# Patient Record
Sex: Female | Born: 2000
Health system: Southern US, Community
[De-identification: ages and names within clinical notes are randomized; demographics above are authoritative.]

## PROBLEM LIST (undated history)

## (undated) DIAGNOSIS — R569 Unspecified convulsions: Secondary | ICD-10-CM

## (undated) DIAGNOSIS — G901 Familial dysautonomia [Riley-Day]: Secondary | ICD-10-CM

## (undated) DIAGNOSIS — F840 Autistic disorder: Secondary | ICD-10-CM

## (undated) DIAGNOSIS — F32A Depression, unspecified: Secondary | ICD-10-CM

## (undated) DIAGNOSIS — K219 Gastro-esophageal reflux disease without esophagitis: Secondary | ICD-10-CM

## (undated) HISTORY — DX: Depression, unspecified: F32.A

## (undated) HISTORY — DX: Autistic disorder: F84.0

## (undated) HISTORY — DX: Gastro-esophageal reflux disease without esophagitis: K21.9

## (undated) HISTORY — DX: Unspecified convulsions: R56.9

## (undated) HISTORY — DX: Familial dysautonomia (riley-day): G90.1

---
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
## 2022-07-05 IMAGING — US US ABDOMEN LIMITED
1 series · 14 of 25 positions shown · non-contrast
Comparison: None.

CLINICAL DATA: Initial evaluation for right upper quadrant
abdominal pain.

EXAM:
ULTRASOUND ABDOMEN LIMITED RIGHT UPPER QUADRANT

[Series 1: us abdomen limited · 14 of 43 slices shown]
[im 1/43]
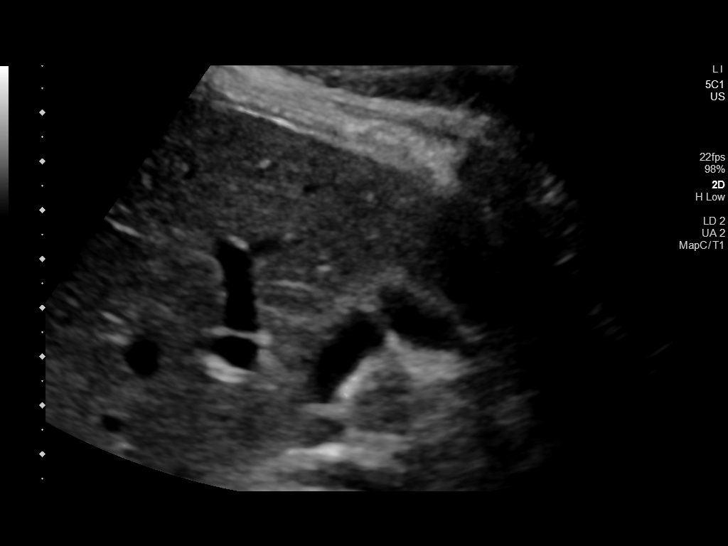
[im 4/43]
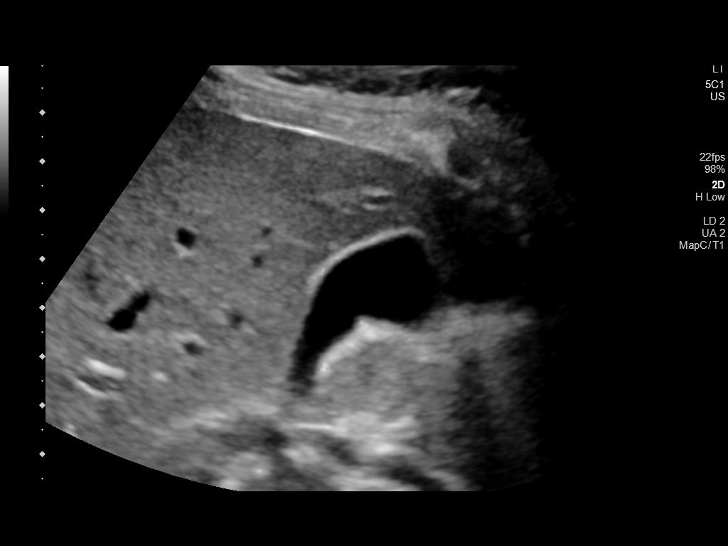
[im 8/43]
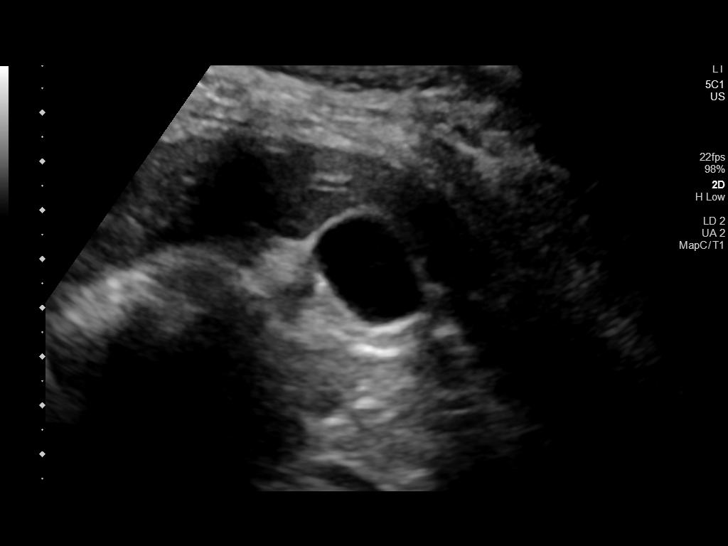
[im 11/43]
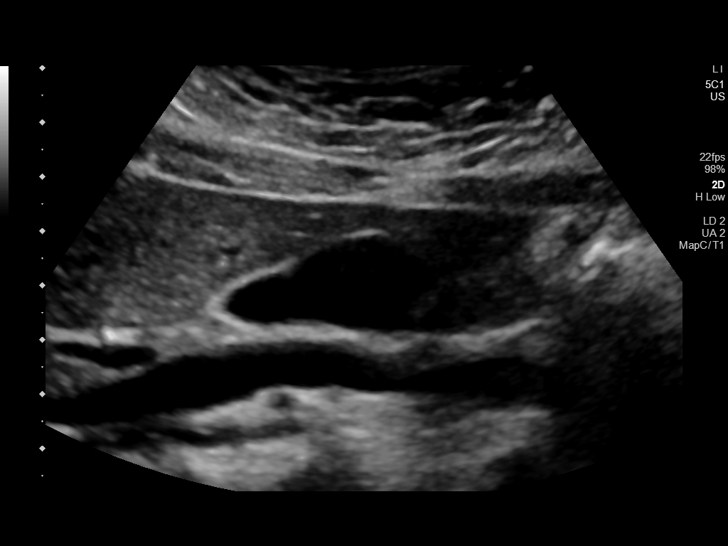
[im 15/43]
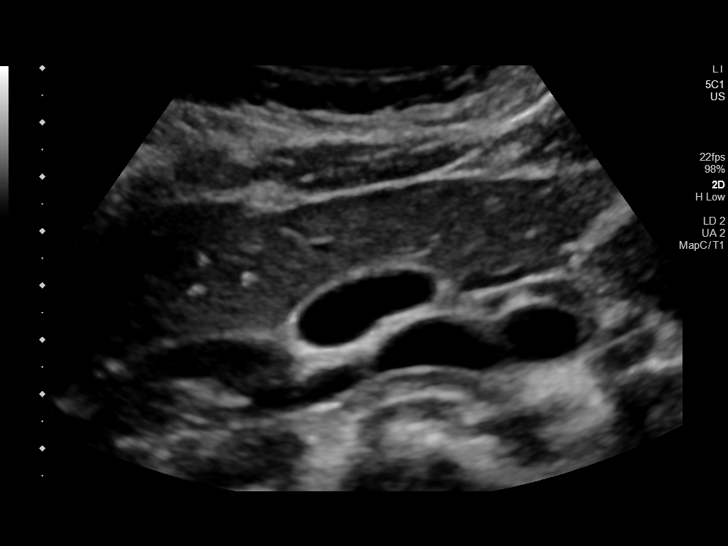
[im 16/43]
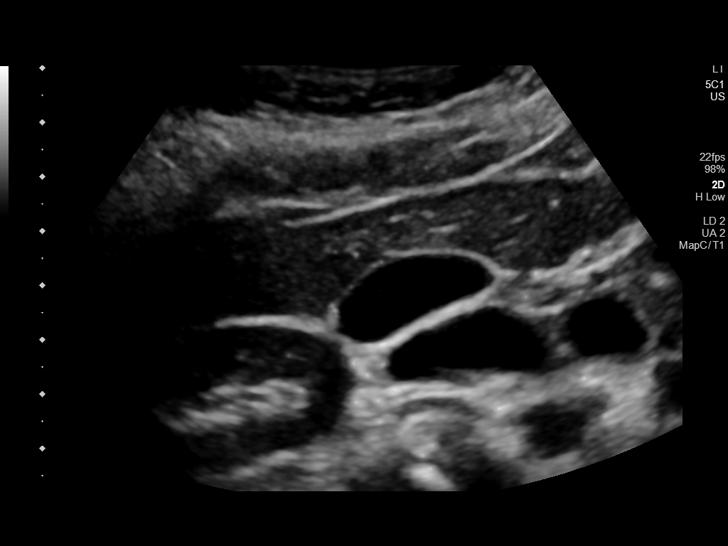
[im 20/43]
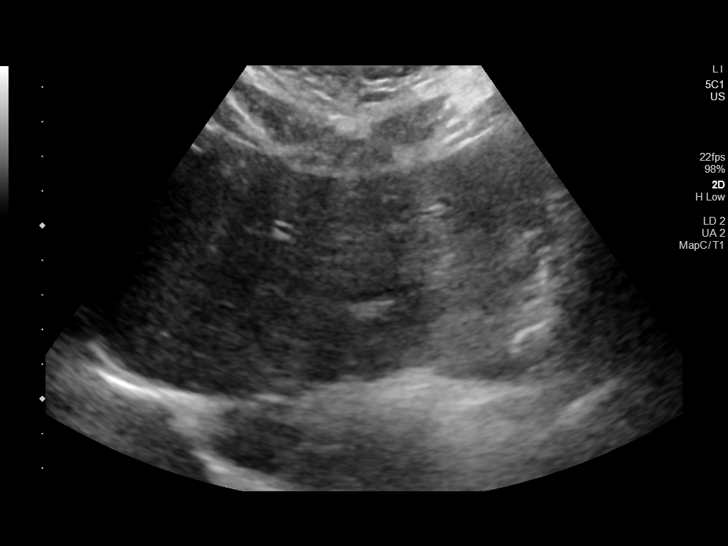
[im 23/43]
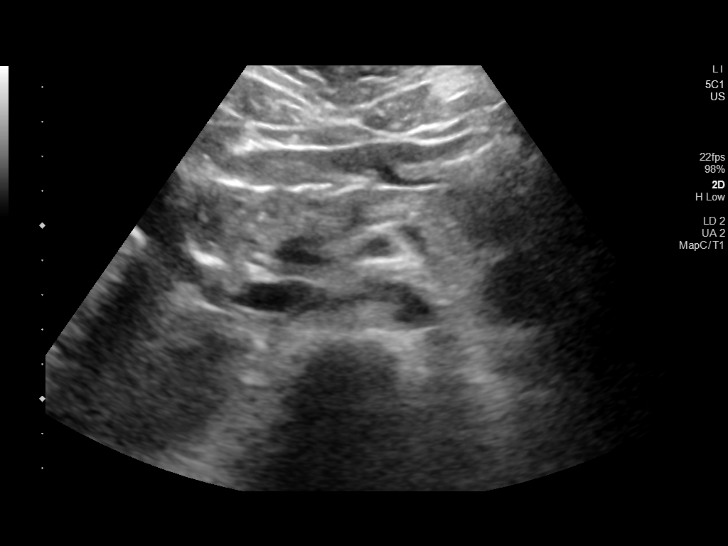
[im 27/43]
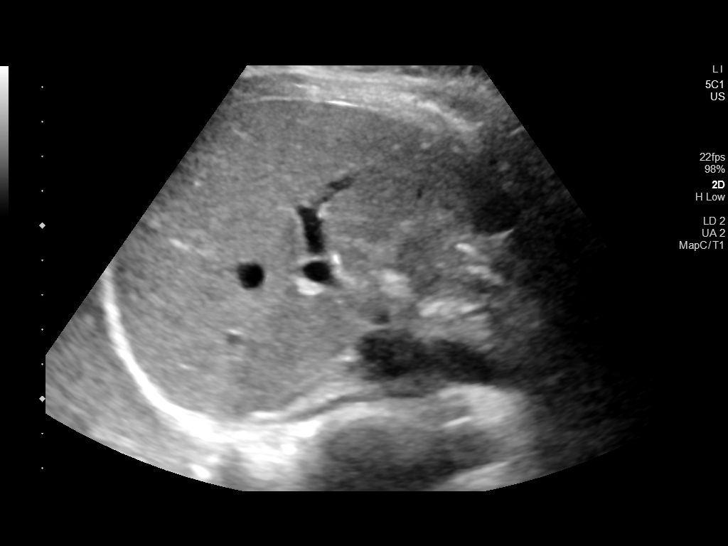
[im 29/43]
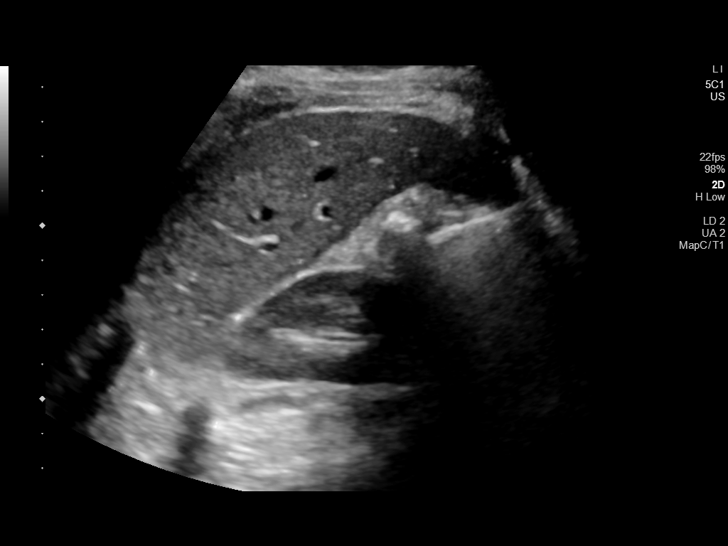
[im 32/43]
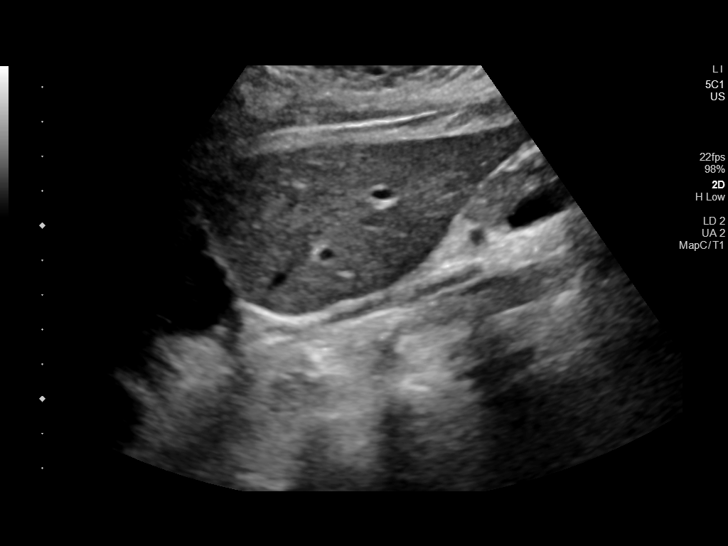
[im 36/43]
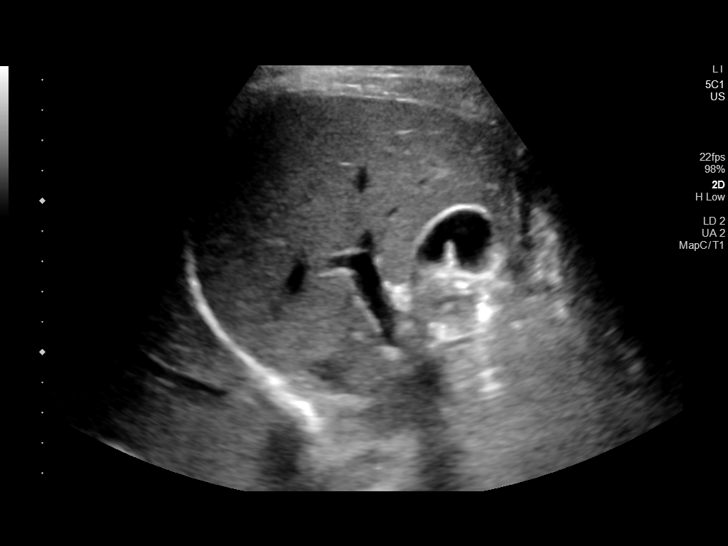
[im 39/43]
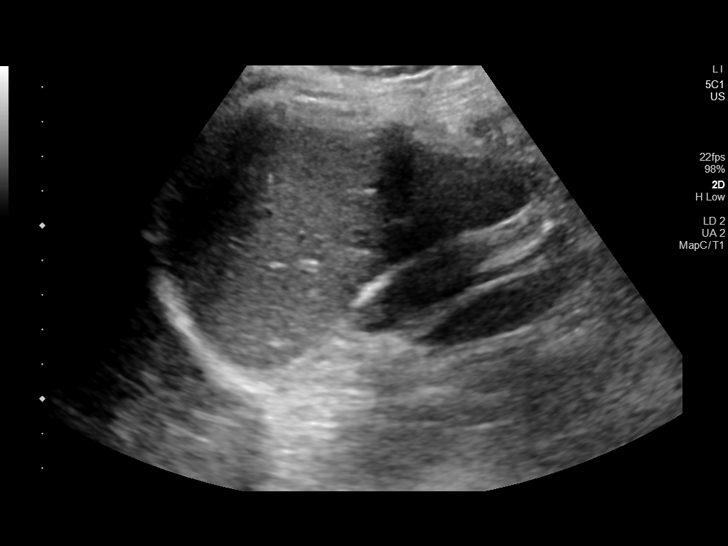
[im 43/43]
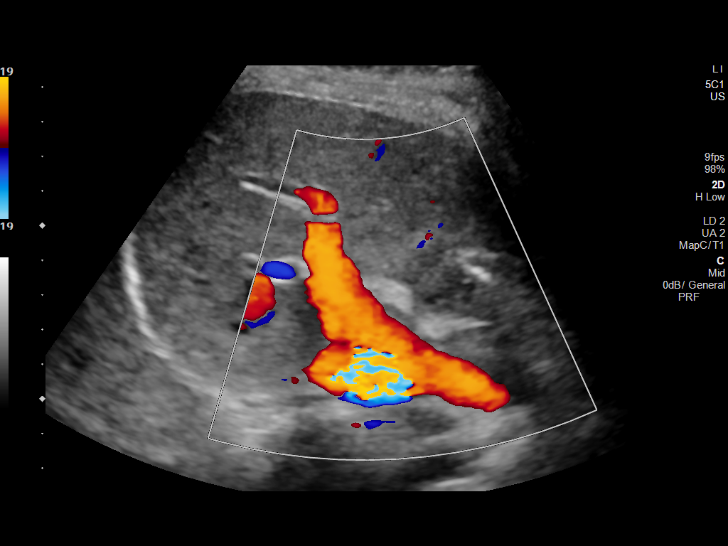

[14 of 25 positions shown; findings below may reference images not displayed]

FINDINGS: Gallbladder:

No gallstones or wall thickening visualized. No sonographic Murphy
sign noted by sonographer.

Common bile duct:

Diameter: 2.2 mm

Liver:

No focal lesion identified. Within normal limits in parenchymal
echogenicity. Portal vein is patent on color Doppler imaging with
normal direction of blood flow towards the liver.

Other: None.
IMPRESSION: Normal right upper quadrant ultrasound.

## 2023-01-11 ENCOUNTER — Encounter: Payer: Self-pay | Admitting: Physician Assistant

## 2023-01-11 ENCOUNTER — Ambulatory Visit: Payer: Managed Care, Other (non HMO) | Admitting: Physician Assistant

## 2023-01-11 ENCOUNTER — Other Ambulatory Visit (HOSPITAL_COMMUNITY)
Admission: RE | Admit: 2023-01-11 | Discharge: 2023-01-11 | Disposition: A | Discharge status: Home / Self Care | Payer: Managed Care, Other (non HMO) | Source: Ambulatory Visit | Attending: Physician Assistant | Admitting: Physician Assistant

## 2023-01-11 VITALS — BP 114/78 | HR 70 | Temp 97.5°F | Ht 59.45 in | Wt 195.4 lb

## 2023-01-11 DIAGNOSIS — Z13220 Encounter for screening for lipoid disorders: Secondary | ICD-10-CM

## 2023-01-11 DIAGNOSIS — Z01419 Encounter for gynecological examination (general) (routine) without abnormal findings: Secondary | ICD-10-CM

## 2023-01-11 NOTE — Progress Notes (Signed)
Subjective:    Patient ID: Elizabeth Ball, female    DOB: 03/08/2001, 22 y.o.   MRN: [id number]  Chief Complaint  Patient presents with   New Patient (Initial Visit)    New pt in office to establish care with PCP; pt requests wellness visit/CPE today as well; last seen PCP since 2019; pt has no concerns to discuss at this time; pt is fasting in case labs being completed and also requests all labs to go to [facility] due to Mom working for [facility]    HPI 22 y.o. patient presents today for new patient establishment with me.   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   Current Care Team: None    Acute Concerns: None, needing physical completed.    Past Medical History:  Diagnosis Date   Autism    Depression    Dysautonomia (HCC)    GERD (gastroesophageal reflux disease)    Seizures (HCC)    childhood, grew out of this age 57 or so    History reviewed. No pertinent surgical history.  Family History  Problem Relation Age of Onset   Healthy Mother    High Cholesterol Father    Cancer Maternal Grandmother    Hyperlipidemia Maternal Grandmother    Hypertension Maternal Grandmother    Depression Maternal Grandfather    Heart attack Maternal Grandfather    Hypertension Maternal Grandfather    Hyperlipidemia Maternal Grandfather    Cancer Paternal Grandfather        Died at the age of 75-Lymphoma    Social History   Tobacco Use   Smoking status: Never   Smokeless tobacco: Never  Vaping Use   Vaping Use: Never used  Substance Use Topics   Alcohol use: Yes    Alcohol/week: 3.0 standard drinks of alcohol    Types: 1 Glasses of wine, 1 Cans of beer, 1 Shots of liquor per week   Drug use: Not Currently    Types: Marijuana     Allergies  Allergen Reactions   Amoxicillin     Parents reported seizures    Review of Systems NEGATIVE UNLESS OTHERWISE INDICATED IN HPI      Objective:     BP 114/78 (BP Location: Left Arm)   Pulse 70   Temp (!) 97.5 F (36.4 C)  (Temporal)   Ht 4' 11.45" (1.51 m)   Wt 195 lb 6.4 oz (88.6 kg)   LMP 01/04/2023 (Approximate)   SpO2 99%   BMI 38.87 kg/m   Wt Readings from Last 3 Encounters:  01/11/23 195 lb 6.4 oz (88.6 kg)  12/20/17 99 lb 9.6 oz (45.2 kg) (11 %, Z= -1.20)*  11/12/17 99 lb 9.6 oz (45.2 kg) (12 %, Z= -1.17)*   * Growth percentiles are based on CDC (Girls, 2-20 Years) data.    BP Readings from Last 3 Encounters:  01/11/23 114/78  12/20/17 (!) 102/62 (36 %, Z = -0.36 /  46 %, Z = -0.10)*  11/12/17 (!) 100/60 (31 %, Z = -0.50 /  40 %, Z = -0.25)*   *BP percentiles are based on the 2017 AAP Clinical Practice Guideline for girls     Physical Exam Vitals and nursing note reviewed. Exam conducted with a chaperone present.  Constitutional:      Appearance: Normal appearance. She is normal weight. She is not toxic-appearing.  HENT:     Head: Normocephalic and atraumatic.     Right Ear: Tympanic membrane, ear canal and external ear normal.  Left Ear: Tympanic membrane, ear canal and external ear normal.     Nose: Nose normal.     Mouth/Throat:     Mouth: Mucous membranes are moist.  Eyes:     Extraocular Movements: Extraocular movements intact.     Conjunctiva/sclera: Conjunctivae normal.     Pupils: Pupils are equal, round, and reactive to light.  Neck:     Thyroid: No thyroid mass, thyromegaly or thyroid tenderness.  Cardiovascular:     Rate and Rhythm: Normal rate and regular rhythm.     Pulses: Normal pulses.     Heart sounds: Normal heart sounds.  Pulmonary:     Effort: Pulmonary effort is normal.     Breath sounds: Normal breath sounds.  Chest:     Chest wall: No mass.  Breasts:    Right: Normal. No swelling, bleeding, inverted nipple, mass, nipple discharge, skin change or tenderness.     Left: Normal. No swelling, bleeding, inverted nipple, mass, nipple discharge, skin change or tenderness.  Abdominal:     General: Abdomen is flat. Bowel sounds are normal.     Palpations:  Abdomen is soft.  Genitourinary:    General: Normal vulva.     Labia:        Right: No rash, tenderness or lesion.        Left: No rash, tenderness or lesion.      Vagina: Normal.     Cervix: Cervical bleeding (spotting) present.     Uterus: Normal.      Adnexa: Right adnexa normal and left adnexa normal.  Musculoskeletal:        General: Normal range of motion.     Cervical back: Normal range of motion and neck supple.     Right lower leg: No edema.     Left lower leg: No edema.  Lymphadenopathy:     Cervical: No cervical adenopathy.     Upper Body:     Right upper body: No supraclavicular, axillary or pectoral adenopathy.     Left upper body: No supraclavicular, axillary or pectoral adenopathy.  Skin:    General: Skin is warm and dry.     Findings: No lesion.  Neurological:     General: No focal deficit present.     Mental Status: She is alert and oriented to person, place, and time.  Psychiatric:        Mood and Affect: Mood normal.        Behavior: Behavior normal.        Thought Content: Thought content normal.        Judgment: Judgment normal.        Assessment & Plan:  Well woman exam with routine gynecological exam -     Cytology - PAP -     CBC with Differential/Platelet -     Comprehensive metabolic panel -     Lipid panel -     TSH  Screening for cholesterol level -     Lipid panel   Age-appropriate screening and counseling performed today. Will check labs and call with results. Preventive measures discussed and printed in AVS for patient.   Patient Counseling: [x]   Nutrition: Stressed importance of moderation in sodium/caffeine intake, saturated fat and cholesterol, caloric balance, sufficient intake of fresh fruits, vegetables, and fiber.  [x]   Stressed the importance of regular exercise.   [x]   Substance Abuse: Discussed cessation/primary prevention of tobacco, alcohol, or other drug use; driving or other dangerous activities under the influence;  availability of treatment for abuse.   []   Injury prevention: Discussed safety belts, safety helmets, smoke detector, smoking near bedding or upholstery.   [x]   Sexuality: Discussed sexually transmitted diseases, partner selection, use of condoms, avoidance of unintended pregnancy  and contraceptive alternatives.   [x]   Dental health: Discussed importance of regular tooth brushing, flossing, and dental visits.  [x]   Health maintenance and immunizations reviewed. Please refer to Health maintenance section.      Return in about 1 year (around 01/11/2024) for physical.     Modine Oppenheimer M Caliana Spires, PA-C

## 2023-01-11 NOTE — Patient Instructions (Addendum)
Welcome to [facility]! It was a pleasure meeting you today.  Labs and pap completed today.  As discussed, Please schedule a 12 month follow up visit today.  PLEASE NOTE:  If you had any LAB tests please let us know if you have not heard back within a few days. You may see your results on MyChart before we have a chance to review them but we will give you a call once they are reviewed by [place]. If we ordered any REFERRALS today, please let us know if you have not heard from their office within the next two weeks. Let us know through MyChart if you are needing REFILLS, or have your pharmacy send [place] the request. You can also use MyChart to communicate with me or any office staff.  Please try these tips to maintain a healthy lifestyle:  Eat most of your calories during the day when you are active. Eliminate processed foods including packaged sweets (pies, cakes, cookies), reduce intake of potatoes, white bread, white pasta, and white rice. Look for whole grain options, oat flour or almond flour.  Each meal should contain half fruits/vegetables, one quarter protein, and one quarter carbs (no bigger than a computer mouse).  Cut down on sweet beverages. This includes juice, soda, and sweet tea. Also watch fruit intake, though this is a healthier sweet option, it still contains natural sugar! Limit to 3 servings daily.  Drink at least 1 glass of water with each meal and aim for at least 8 glasses (64 ounces) per day.  Exercise at least 150 minutes every week to the best of your ability.    Take Care,  Lucretia Pendley, PA-C

## 2023-01-12 LAB — CBC WITH DIFFERENTIAL/PLATELET
Basophils Absolute: 0 10*3/uL (ref 0.0–0.2)
Basos: 0 %
EOS (ABSOLUTE): 0.1 10*3/uL (ref 0.0–0.4)
Eos: 2 %
Hematocrit: 45.1 % (ref 34.0–46.6)
Hemoglobin: 15.2 g/dL (ref 11.1–15.9)
Immature Grans (Abs): 0 10*3/uL (ref 0.0–0.1)
Immature Granulocytes: 0 %
Lymphocytes Absolute: 1.7 10*3/uL (ref 0.7–3.1)
Lymphs: 28 %
MCH: 30.8 pg (ref 26.6–33.0)
MCHC: 33.7 g/dL (ref 31.5–35.7)
MCV: 91 fL (ref 79–97)
Monocytes Absolute: 0.5 10*3/uL (ref 0.1–0.9)
Monocytes: 8 %
Neutrophils Absolute: 3.9 10*3/uL (ref 1.4–7.0)
Neutrophils: 62 %
Platelets: 241 10*3/uL (ref 150–450)
RBC: 4.94 x10E6/uL (ref 3.77–5.28)
RDW: 13.1 % (ref 11.7–15.4)
WBC: 6.3 10*3/uL (ref 3.4–10.8)

## 2023-01-12 LAB — LIPID PANEL
Chol/HDL Ratio: 6.6 ratio — ABNORMAL HIGH (ref 0.0–4.4)
Cholesterol, Total: 178 mg/dL (ref 100–199)
HDL: 27 mg/dL — ABNORMAL LOW (ref >39)
LDL Chol Calc (NIH): 105 mg/dL — ABNORMAL HIGH (ref 0–99)
Triglycerides: 265 mg/dL — ABNORMAL HIGH (ref 0–149)
VLDL Cholesterol Cal: 46 mg/dL — ABNORMAL HIGH (ref 5–40)

## 2023-01-12 LAB — COMPREHENSIVE METABOLIC PANEL
ALT: 14 IU/L (ref 0–32)
AST: 15 IU/L (ref 0–40)
Albumin/Globulin Ratio: 1.6 (ref 1.2–2.2)
Albumin: 4.2 g/dL (ref 4.0–5.0)
Alkaline Phosphatase: 97 IU/L (ref 44–121)
BUN/Creatinine Ratio: 16 (ref 9–23)
BUN: 13 mg/dL (ref 6–20)
Bilirubin Total: 0.4 mg/dL (ref 0.0–1.2)
CO2: 19 mmol/L — ABNORMAL LOW (ref 20–29)
Calcium: 9.1 mg/dL (ref 8.7–10.2)
Chloride: 107 mmol/L — ABNORMAL HIGH (ref 96–106)
Creatinine, Ser: 0.8 mg/dL (ref 0.57–1.00)
Globulin, Total: 2.6 g/dL (ref 1.5–4.5)
Glucose: 103 mg/dL — ABNORMAL HIGH (ref 70–99)
Potassium: 4.4 mmol/L (ref 3.5–5.2)
Sodium: 143 mmol/L (ref 134–144)
Total Protein: 6.8 g/dL (ref 6.0–8.5)
eGFR: 107 mL/min/{1.73_m2} (ref >59)

## 2023-01-12 LAB — TSH: TSH: 2.48 u[IU]/mL (ref 0.450–4.500)

## 2023-01-17 LAB — CYTOLOGY - PAP
Chlamydia: NEGATIVE
Comment: NEGATIVE
Comment: NEGATIVE
Comment: NORMAL
Diagnosis: NEGATIVE
High risk HPV: NEGATIVE
Neisseria Gonorrhea: NEGATIVE

## 2023-12-19 ENCOUNTER — Encounter: Payer: Self-pay | Admitting: Physician Assistant

## 2023-12-19 ENCOUNTER — Ambulatory Visit (INDEPENDENT_AMBULATORY_CARE_PROVIDER_SITE_OTHER)

## 2023-12-19 ENCOUNTER — Ambulatory Visit: Admitting: Physician Assistant

## 2023-12-19 ENCOUNTER — Ambulatory Visit

## 2023-12-19 ENCOUNTER — Other Ambulatory Visit: Payer: Self-pay | Admitting: Physician Assistant

## 2023-12-19 VITALS — BP 102/84 | HR 64 | Temp 97.5°F | Ht 59.0 in | Wt 182.0 lb

## 2023-12-19 DIAGNOSIS — R350 Frequency of micturition: Secondary | ICD-10-CM | POA: Diagnosis not present

## 2023-12-19 DIAGNOSIS — R195 Other fecal abnormalities: Secondary | ICD-10-CM | POA: Diagnosis not present

## 2023-12-19 DIAGNOSIS — N921 Excessive and frequent menstruation with irregular cycle: Secondary | ICD-10-CM

## 2023-12-19 DIAGNOSIS — R1084 Generalized abdominal pain: Secondary | ICD-10-CM

## 2023-12-19 LAB — POC URINALSYSI DIPSTICK (AUTOMATED)
Bilirubin, UA: NEGATIVE
Blood, UA: POSITIVE
Glucose, UA: NEGATIVE
Ketones, UA: NEGATIVE
Leukocytes, UA: NEGATIVE
Nitrite, UA: NEGATIVE
Protein, UA: NEGATIVE
Spec Grav, UA: 1.02 (ref 1.010–1.025)
Urobilinogen, UA: 0.2 U/dL
pH, UA: 6.5 (ref 5.0–8.0)

## 2023-12-19 LAB — POCT URINE PREGNANCY: Preg Test, Ur: NEGATIVE

## 2023-12-19 NOTE — Progress Notes (Signed)
 Patient ID: Elizabeth Ball, female    DOB: 06-26-2001, 23 y.o.   MRN: [id number]   Assessment & Plan:  Diffuse abdominal pain -     CBC with Differential/Platelet -     Comprehensive metabolic panel with GFR -     Lipase -     H. pylori breath test -     DG Abd 2 Views; Future  Change in consistency of stool -     CBC with Differential/Platelet -     Comprehensive metabolic panel with GFR -     Lipase -     H. pylori breath test -     DG Abd 2 Views; Future  Menorrhagia with irregular cycle -     POCT urine pregnancy  Frequent urination -     POCT Urinalysis Dipstick (Automated)      Assessment and Plan Assessment & Plan Diffuse abdominal pain and gastrointestinal symptoms Diffuse abdominal pain with gastrointestinal symptoms including diarrhea, constipation, and anal leakage for six weeks. Symptoms include back pain, chest pain, and severe stomach pain. Recent ciprofloxacin use. Differential diagnosis includes colitis, possibly post-antibiotic or independent, severe constipation, gallbladder or pancreas concerns, and H. pylori infection. Symptoms may be exacerbated by constipation causing referred pain to the chest and back. Decision to avoid CT scan due to radiation risk and opt for an abdominal x-ray to minimize exposure while obtaining necessary diagnostic information. - Order CBC, CMP, lipase, and H. pylori breath test. - Order abdominal x-ray, two views. - Perform urinalysis and pregnancy test. If pregnancy test is negative, proceed with further diagnostics.  Frequent urination and dark urine Frequent urination with dark yellow to orange urine. Possible dehydration due to limited fluid intake. No dysuria or hematuria reported. - Encourage increased fluid intake. - Include urinalysis in lab work.  Abnormal menstrual bleeding Abnormal menstrual bleeding with ongoing period for three weeks. She is on Nexplanon, which can cause irregular bleeding. Pregnancy is  unlikely but will be ruled out with a test. - Perform pregnancy test to rule out pregnancy. - Monitor menstrual bleeding pattern.      Return if symptoms worsen or fail to improve.    Subjective:    Chief Complaint  Patient presents with   Abdominal Pain    Pt in office, overdue for CPE; c/o abdominal and back pain, acid refulx; and also green stool for the past 6 wks; pt states abdominal pain is upper abdomen; pt concerned with possible GI issues and wanting to have labs completed; pt not sure if this is gallbladder related or pancreas issues; pt has been using OTC meds such as TUMS etc to help but no relief of symptoms; pt admits to being on cycle for past 3 weeks;    Abdominal Pain   Discussed the use of AI scribe software for clinical note transcription with the patient, who gave verbal consent to proceed.  History of Present Illness Elizabeth Ball is a 23 year old female who presents with severe abdominal pain, diarrhea, and abnormal menstrual bleeding.  She has been experiencing severe abdominal pain for the past six weeks, which began with stomach discomfort and has since spread to her back and chest. The chest pain is located on the left side and central. She also reports significant acid reflux with painful burping.  Gastrointestinal symptoms include diarrhea that transitioned to constipation, leading to anal leakage. Recently, she has had severe constipation alternating with diarrhea. Her bowel movements have been mostly liquid,  bright green, and foul-smelling. No blood in the stool has been noticed, but the green color may obscure it.  She has been experiencing an ongoing menstrual period for three weeks. She uses Nexplanon for birth control and has noted irregular bleeding patterns in the past, but the current extended period is concerning to her.  There has been a significant weight loss of 13 pounds since last year without intentional dietary changes or increased  exercise. She has felt feverish without confirming a fever and has experienced fluctuating weight.  Urinary symptoms include dark yellow to orange urine and frequent urination, particularly at night. She also reports feeling nauseous, lightheaded, and having headaches.  She says she has a history of intestinal metaplasia diagnosed after a previous endoscopy. Previously went to [facility] for this, but has been several years.  She was on antibiotics, possibly ciprofloxacin, for an eye infection about two months ago, but her symptoms began well after completing the course she thinks.  She has been unable to eat much due to feeling full quickly, limiting her intake to a small lunch. She has tried Tylenol  for symptom relief without success.  She experiences 'pins and needles' sensations in her hands and arms, which she attributes to repetitive use at work. She also reports being gassy, bloated, and experiencing frequent belching and burping.     Past Medical History:  Diagnosis Date   Autism    Depression    Dysautonomia (HCC)    GERD (gastroesophageal reflux disease)    Seizures (HCC)    childhood, grew out of this age 63 or so    History reviewed. No pertinent surgical history.  Family History  Problem Relation Age of Onset   Healthy Mother    High Cholesterol Father    Cancer Maternal Grandmother    Hyperlipidemia Maternal Grandmother    Hypertension Maternal Grandmother    Depression Maternal Grandfather    Heart attack Maternal Grandfather    Hypertension Maternal Grandfather    Hyperlipidemia Maternal Grandfather    Cancer Paternal Grandfather        Died at the age of 75-Lymphoma    Social History   Tobacco Use   Smoking status: Never   Smokeless tobacco: Never  Vaping Use   Vaping status: Never Used  Substance Use Topics   Alcohol use: Yes    Alcohol/week: 3.0 standard drinks of alcohol    Types: 1 Glasses of wine, 1 Cans of beer, 1 Shots of liquor per week    Drug use: Not Currently    Types: Marijuana     Allergies  Allergen Reactions   Amoxicillin     Parents reported seizures    Review of Systems  Gastrointestinal:  Positive for abdominal pain.   NEGATIVE UNLESS OTHERWISE INDICATED IN HPI      Objective:     BP 102/84 (BP Location: Left Arm, Patient Position: Sitting, Cuff Size: Normal)   Pulse 64   Temp (!) 97.5 F (36.4 C) (Temporal)   Ht 4\' 11"  (1.499 m)   Wt 182 lb (82.6 kg)   LMP 11/28/2023 (Approximate)   SpO2 99%   BMI 36.76 kg/m   Wt Readings from Last 3 Encounters:  12/19/23 182 lb (82.6 kg)  01/11/23 195 lb 6.4 oz (88.6 kg)  12/20/17 99 lb 9.6 oz (45.2 kg) (11%, Z= -1.20)*   * Growth percentiles are based on CDC (Girls, 2-20 Years) data.    BP Readings from Last 3 Encounters:  12/19/23 102/84  01/11/23 114/78  12/20/17 (!) 102/62 (36%, Z = -0.36 /  46%, Z = -0.10)*   *BP percentiles are based on the 2017 AAP Clinical Practice Guideline for girls     Physical Exam Vitals and nursing note reviewed.  Constitutional:      General: She is not in acute distress.    Appearance: Normal appearance. She is not ill-appearing.  HENT:     Head: Normocephalic and atraumatic.  Cardiovascular:     Rate and Rhythm: Normal rate and regular rhythm.     Pulses: Normal pulses.     Heart sounds: Normal heart sounds.  Pulmonary:     Effort: Pulmonary effort is normal.     Breath sounds: Normal breath sounds.  Abdominal:     General: Abdomen is flat. Bowel sounds are normal.     Palpations: Abdomen is soft.     Tenderness: There is generalized abdominal tenderness. There is no right CVA tenderness, left CVA tenderness, guarding or rebound.  Skin:    General: Skin is warm and dry.  Neurological:     General: No focal deficit present.     Mental Status: She is alert.  Psychiatric:        Mood and Affect: Mood normal.       Rosemarie Galvis M Raef Sprigg, PA-C

## 2023-12-19 NOTE — Patient Instructions (Signed)
 Labs, urine, XRAY in office today to help determine cause of symptoms.  IF suddenly, severe, worse, go to ER.

## 2023-12-20 LAB — COMPREHENSIVE METABOLIC PANEL WITH GFR
ALT: 11 IU/L (ref 0–32)
AST: 15 IU/L (ref 0–40)
Albumin: 4.4 g/dL (ref 4.0–5.0)
Alkaline Phosphatase: 80 IU/L (ref 44–121)
BUN/Creatinine Ratio: 15 (ref 9–23)
BUN: 13 mg/dL (ref 6–20)
Bilirubin Total: 0.3 mg/dL (ref 0.0–1.2)
CO2: 22 mmol/L (ref 20–29)
Calcium: 9.4 mg/dL (ref 8.7–10.2)
Chloride: 104 mmol/L (ref 96–106)
Creatinine, Ser: 0.86 mg/dL (ref 0.57–1.00)
Globulin, Total: 2.7 g/dL (ref 1.5–4.5)
Glucose: 86 mg/dL (ref 70–99)
Potassium: 4.6 mmol/L (ref 3.5–5.2)
Sodium: 141 mmol/L (ref 134–144)
Total Protein: 7.1 g/dL (ref 6.0–8.5)
eGFR: 99 mL/min/{1.73_m2} (ref >59)

## 2023-12-20 LAB — CBC WITH DIFFERENTIAL/PLATELET
Basophils Absolute: 0 10*3/uL (ref 0.0–0.2)
Basos: 0 %
EGFR: 99
EOS (ABSOLUTE): 0.1 10*3/uL (ref 0.0–0.4)
Eos: 1 %
Hematocrit: 46.1 % (ref 34.0–46.6)
Hemoglobin: 15.2 g/dL (ref 11.1–15.9)
Immature Grans (Abs): 0 10*3/uL (ref 0.0–0.1)
Immature Granulocytes: 0 %
Lymphocytes Absolute: 2 10*3/uL (ref 0.7–3.1)
Lymphs: 29 %
MCH: 30.2 pg (ref 26.6–33.0)
MCHC: 33 g/dL (ref 31.5–35.7)
MCV: 92 fL (ref 79–97)
Monocytes Absolute: 0.5 10*3/uL (ref 0.1–0.9)
Monocytes: 8 %
Neutrophils Absolute: 4.1 10*3/uL (ref 1.4–7.0)
Neutrophils: 62 %
Platelets: 237 10*3/uL (ref 150–450)
RBC: 5.03 x10E6/uL (ref 3.77–5.28)
RDW: 13 % (ref 11.7–15.4)
WBC: 6.7 10*3/uL (ref 3.4–10.8)

## 2023-12-20 LAB — H. PYLORI BREATH TEST: H pylori Breath Test: NEGATIVE

## 2023-12-20 LAB — LIPASE: Lipase: 30 U/L (ref 14–72)

## 2023-12-21 ENCOUNTER — Encounter: Payer: Self-pay | Admitting: Physician Assistant

## 2024-01-14 ENCOUNTER — Encounter: Payer: Managed Care, Other (non HMO) | Admitting: Physician Assistant

## 2024-01-21 ENCOUNTER — Encounter: Payer: Managed Care, Other (non HMO) | Admitting: Physician Assistant

## 2024-04-10 ENCOUNTER — Encounter: Payer: Self-pay | Admitting: Physician Assistant

## 2024-04-10 ENCOUNTER — Ambulatory Visit: Admitting: Physician Assistant

## 2024-04-10 VITALS — BP 118/60 | HR 84 | Temp 97.7°F | Ht 59.0 in | Wt 185.0 lb

## 2024-04-10 DIAGNOSIS — R635 Abnormal weight gain: Secondary | ICD-10-CM | POA: Diagnosis not present

## 2024-04-10 DIAGNOSIS — N921 Excessive and frequent menstruation with irregular cycle: Secondary | ICD-10-CM | POA: Diagnosis not present

## 2024-04-10 DIAGNOSIS — F39 Unspecified mood [affective] disorder: Secondary | ICD-10-CM | POA: Diagnosis not present

## 2024-04-10 NOTE — Progress Notes (Signed)
 Patient ID: Elizabeth Ball, female    DOB: 12/15/2000, 23 y.o.   MRN: [id number]   Assessment & Plan:  Mood disorder (HCC) -     Ambulatory referral to Psychiatry  Menorrhagia with irregular cycle -     PCOS Diagnostic Profile -     Insulin, random  Abnormal weight gain -     Insulin, random      Assessment and Plan Assessment & Plan Depression and possible ADHD/autism spectrum disorder Chronic depression with symptoms of disassociation, memory issues, and suicidal ideation, exacerbated by stress and lack of support. Possible ADHD and autism spectrum disorder based on past diagnoses and family history. Symptoms include difficulty with focus and memory. Family history of mental health issues, including anxiety and anger issues. She is seeking evaluation and treatment after moving out of a non-supportive environment. - Refer to [facility] for comprehensive assessment of depression, ADHD, and autism spectrum disorder. - Discuss potential initiation of medication if [facility] cannot provide immediate appointment. - Encourage her to contact [facility] directly for appointment scheduling.  Polycystic ovary syndrome with irregular menses, hirsutism, and weight gain Irregular menses and hirsutism consistent with PCOS. Weight gain despite dietary changes. Previous irregular periods before Nexplanon insertion. Concerns about excessive hair growth and weight gain. Discussed potential for PCOS diagnosis and treatment options, including the use of metformin to address insulin resistance and symptoms. Explained that metformin, although traditionally a diabetic medication, can help with PCOS symptoms by reducing insulin resistance and preventing new sugar production. - Order PCOS panel through [facility] to evaluate hormone levels and confirm diagnosis. - Discuss potential referral to gynecologist for further management and consideration of alternative  birth control options. - Consider metformin for management of PCOS symptoms if diagnosis is confirmed.      Return in about 3 months (around 07/11/2024) for recheck/follow-up.    Subjective:    Chief Complaint  Patient presents with   Follow-up    Have concerns about PCOS; excessive hair growth (facial), weight gain, heavy periods; been a few months to a year; Last period of 7/31-8/03, and now on it again started 8/12; bad cramping;  Also want to discuss medication for ADHD    HPI Discussed the use of AI scribe software for clinical note transcription with the patient, who gave verbal consent to proceed.  History of Present Illness Elizabeth Ball is a 23 year old female who presents with depression and ADHD.  She has experienced symptoms of depression for several years, and reports that her symptoms have worsened in the last few months. She has days where she feels unable to get out of bed, which has led to absenteeism from work and affected her job performance. 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 She experiences feelings of emptiness, disassociation, and memory issues, such as forgetting important details and events. During periods of high stress, she has suicidal thoughts, though she is not currently experiencing these thoughts.  She has a history of being diagnosed with depression and ADHD in the past, and was diagnosed with ADHD and autism at the age of [age]. Her mother was against medication for these conditions, and she has not been treated. She is concerned that these issues may affect her ability to manage school and work responsibilities.  There is a family history of mental health issues, with her mother being anxious and possibly depressed,  and her father having anger issues. Her brother and father both have ADHD.  Socially, she has recently moved out of her mother's home and is living  alone. She occasionally smokes cigarettes and drinks alcohol, but not on a daily basis. She has a concealed carry permit and acknowledges having the means to harm herself, but states that thoughts of her fianc and her future goals keep her from acting on these thoughts.  She is currently using Nexplanon for birth control, which was placed by [facility] and is set to expire in 2026. Her periods have remained irregular, and she is concerned about excessive hair growth, which she suspects may be related to PCOS. She has experienced weight gain despite dietary changes, and previous lab work showed normal results.     Past Medical History:  Diagnosis Date   Autism    Depression    Dysautonomia (HCC)    GERD (gastroesophageal reflux disease)    Seizures (HCC)    childhood, grew out of this age 51 or so    History reviewed. No pertinent surgical history.  Family History  Problem Relation Age of Onset   Healthy Mother    High Cholesterol Father    Cancer Maternal Grandmother    Hyperlipidemia Maternal Grandmother    Hypertension Maternal Grandmother    Depression Maternal Grandfather    Heart attack Maternal Grandfather    Hypertension Maternal Grandfather    Hyperlipidemia Maternal Grandfather    Cancer Paternal Grandfather        Died at the age of 75-Lymphoma    Social History   Tobacco Use   Smoking status: Never   Smokeless tobacco: Never  Vaping Use   Vaping status: Never Used  Substance Use Topics   Alcohol use: Yes    Alcohol/week: 3.0 standard drinks of alcohol    Types: 1 Glasses of wine, 1 Cans of beer, 1 Shots of liquor per week   Drug use: Not Currently    Types: Marijuana     Allergies  Allergen Reactions   Amoxicillin     Parents reported seizures    Review of Systems NEGATIVE UNLESS OTHERWISE INDICATED IN HPI      Objective:     BP 118/60   Pulse 84   Temp 97.7 F (36.5 C)   Ht 4' 11 (1.499 m)   Wt 185 lb (83.9 kg)   LMP  03/27/2024 (Exact Date)   SpO2 96%   BMI 37.37 kg/m   Wt Readings from Last 3 Encounters:  04/10/24 185 lb (83.9 kg)  12/19/23 182 lb (82.6 kg)  01/11/23 195 lb 6.4 oz (88.6 kg)    BP Readings from Last 3 Encounters:  04/10/24 118/60  12/19/23 102/84  01/11/23 114/78     Physical Exam Vitals and nursing note reviewed.  Constitutional:      General: She is not in acute distress.    Appearance: Normal appearance. She is obese. She is not ill-appearing.  HENT:     Head: Normocephalic and atraumatic.  Cardiovascular:     Rate and Rhythm: Normal rate and regular rhythm.     Pulses: Normal pulses.     Heart sounds: Normal heart sounds.  Pulmonary:     Effort: Pulmonary effort is normal.     Breath sounds: Normal breath sounds.  Skin:    General: Skin is warm and dry.  Neurological:     General: No focal deficit present.     Mental Status: She is  alert.  Psychiatric:        Mood and Affect: Mood is depressed.             Calleigh Lafontant M Jerome Viglione, PA-C

## 2024-04-10 NOTE — Patient Instructions (Signed)
 If you develop suicidal thoughts, please tell someone and immediately proceed to our local 24/7 crisis center, [facility].[address], [place], [place] [phone].   Follow-up with [facility] today  Keep working on lifestyle goals

## 2024-04-22 ENCOUNTER — Ambulatory Visit: Payer: Self-pay | Admitting: Physician Assistant

## 2024-04-23 ENCOUNTER — Telehealth: Payer: Self-pay

## 2024-04-23 ENCOUNTER — Other Ambulatory Visit: Payer: Self-pay

## 2024-04-23 ENCOUNTER — Other Ambulatory Visit

## 2024-04-23 ENCOUNTER — Telehealth: Payer: Self-pay | Admitting: Physician Assistant

## 2024-04-23 DIAGNOSIS — R635 Abnormal weight gain: Secondary | ICD-10-CM

## 2024-04-23 DIAGNOSIS — N921 Excessive and frequent menstruation with irregular cycle: Secondary | ICD-10-CM

## 2024-04-23 LAB — PCOS DIAGNOSTIC PROFILE
17-Alpha-Hydroxyprogesterone: 41 ng/dL
ANTI-MULLERIAN HORMONE (AMH): 3.35 ng/mL
DHEA-Sulfate, LCMS: 270 ug/dL
Estradiol, Serum, MS: 21 pg/mL
Follicle Stimulating Hormone: 5.2 m[IU]/mL
Free Testosterone, Serum: 9 pg/mL — AB
Luteinizing Hormone (LH) ECL: 10 m[IU]/mL
Prolactin: 14.2 ng/mL
Sex Hormone Binding Globulin: 15 nmol/L — AB
TSH: 1.8 uU/mL
Testosterone, Serum (Total): 31 ng/dL
Testosterone-% Free: 2.9

## 2024-04-23 LAB — INSULIN, RANDOM

## 2024-04-23 NOTE — Telephone Encounter (Signed)
 Patient was scheduled for visit w/o fasting.  Patient will complete fasting lab at next appointment on 9/2.

## 2024-04-23 NOTE — Telephone Encounter (Signed)
LVM to schedule lab only visit

## 2024-04-23 NOTE — Telephone Encounter (Signed)
 Future lab orders placed per PCP,please call pt to schedule fasting lab only appt.

## 2024-04-23 NOTE — Telephone Encounter (Signed)
 Elizabeth Ball with [facility] called to advise PCP Insulin  test ordered was not able to be resulted due to lab processing error on their end. Please advise if you would like test to be re-ordered and pt to come back to lab in office to repeat test.

## 2024-04-23 NOTE — Telephone Encounter (Signed)
 Patient would like to know if an US  would be needed to diagnose PCOS?  If so, would like to know if last US  showed anything relating to PCOS.    Patient also would like Elizabeth Ball to be aware she is considering tubal ligation to lessen symptoms of PCOS.

## 2024-04-24 NOTE — Telephone Encounter (Signed)
Pt has appt next week to discuss.

## 2024-04-24 NOTE — Telephone Encounter (Signed)
 Noted

## 2024-04-25 ENCOUNTER — Telehealth: Payer: Self-pay | Admitting: Physician Assistant

## 2024-04-25 NOTE — Telephone Encounter (Signed)
 Patient called in stating she needed to speak with office manager about a referral to [facility] for ADHD Eval. States she was not informed that service is out of network with her insurance and she now has a Optometrist for $500. I informed patient that the referral process consists of us  sending a referral and the receiving party confirming whether or not they are in network with her insurance. Patient verbalized understanding.   Patient then requested that she would like for PCP ONLY to call about her PCOS results. Lastly patient informed me she would like office manager to call her any day from 2-5 pm to discuss other concerns.

## 2024-04-29 ENCOUNTER — Ambulatory Visit: Admitting: Physician Assistant

## 2024-04-29 NOTE — Telephone Encounter (Signed)
 Returned call to pt per PCP request. LVM with office cb number to return my call if needed. Advised if needing to speak to Alyssa directly she can schedule a VV per PCP recommendations.

## 2024-05-06 ENCOUNTER — Ambulatory Visit: Admitting: Physician Assistant

## 2024-05-06 VITALS — BP 122/76 | HR 85 | Temp 97.7°F | Ht 59.0 in | Wt 179.6 lb

## 2024-05-06 DIAGNOSIS — Z1159 Encounter for screening for other viral diseases: Secondary | ICD-10-CM

## 2024-05-06 DIAGNOSIS — E282 Polycystic ovarian syndrome: Secondary | ICD-10-CM | POA: Diagnosis not present

## 2024-05-06 DIAGNOSIS — F39 Unspecified mood [affective] disorder: Secondary | ICD-10-CM | POA: Diagnosis not present

## 2024-05-06 DIAGNOSIS — Z114 Encounter for screening for human immunodeficiency virus [HIV]: Secondary | ICD-10-CM | POA: Diagnosis not present

## 2024-05-06 MED ORDER — METFORMIN HCL ER 500 MG PO TB24: ORAL_TABLET | ORAL | 2 refills | Status: DC

## 2024-05-06 MED ORDER — BUSPIRONE HCL 7.5 MG PO TABS: 7.5000 mg | ORAL_TABLET | Freq: Two times a day (BID) | ORAL | 1 refills | Status: AC

## 2024-05-06 MED ORDER — ESCITALOPRAM OXALATE 10 MG PO TABS: 10.0000 mg | ORAL_TABLET | Freq: Every day | ORAL | 1 refills | Status: DC

## 2024-05-06 NOTE — Progress Notes (Signed)
 Patient ID: Elizabeth Ball, female    DOB: 07/17/2001, 23 y.o.   MRN: [id number]   Assessment & Plan:  PCOS (polycystic ovarian syndrome) -     Insulin , random -     Ambulatory referral to Gynecology -     metFORMIN  HCl ER; Take 1 tab po with dinner daily.  Dispense: 30 tablet; Refill: 2  Mood disorder (HCC) -     Escitalopram  Oxalate; Take 1 tablet (10 mg total) by mouth daily.  Dispense: 30 tablet; Refill: 1 -     busPIRone  HCl; Take 1 tablet (7.5 mg total) by mouth 2 (two) times daily.  Dispense: 60 tablet; Refill: 1  Need for hepatitis C screening test -     Hepatitis C antibody  Screening for HIV (human immunodeficiency virus) -     HIV Antibody (routine testing w rflx)    Assessment & Plan Polycystic ovarian syndrome (PCOS) with insulin  resistance, abnormal menstruation, and obesity PCOS with insulin  resistance confirmed by elevated free testosterone and low sex hormone binding globulin. Symptoms include frequent and heavy menstruation, nausea, and vomiting in the morning. Obesity and insulin  resistance are present, contributing to weight management challenges. Current contraceptive method (Nexplanon) is not effectively managing PCOS symptoms. - Prescribe metformin  500 mg extended-release, 1 tablet PO with dinner daily to improve insulin  resistance and aid in weight management. - Refer to [facility] for PCOS-specific dietary management. - Order insulin  random test through [facility].  Depression and anxiety Depression and anxiety managed with Buspar  and Lexapro . Reports improvement in daily functioning but notes a return of some symptoms, suggesting current dosages may be suboptimal. - Increase Buspar  to 7.5 mg twice daily. - Increase Lexapro  to 10 mg once daily. - [facility], [place].  Contraceptive management (Nexplanon) and referral for gynecological surgery (hysterectomy/tubal ligation) Nexplanon is due to expire next  year and is not effectively managing PCOS symptoms. Desires permanent contraception through hysterectomy and/or tubal ligation due to PCOS symptoms and personal choice against having children. Discussed implications of hysterectomy, including cessation of menstruation but retention of ovaries to avoid premature menopause. Some PCOS symptoms may persist post-hysterectomy. - Refer to Dr. Almarie Carpen for consultation regarding hysterectomy and/or tubal ligation.      Return in about 3 months (around 08/05/2024) for recheck/follow-up.    Subjective:    Chief Complaint  Patient presents with   Lab Results    Pt in office to discuss recent lab results; wanting to provide another insulin  sample with [facility] losing her sample;     HPI Discussed the use of AI scribe software for clinical note transcription with the patient, who gave verbal consent to proceed.  History of Present Illness Elizabeth Ball is a 23 year old female with depression, anxiety, PTSD, and probable PCOS who presents for medication management and evaluation of PCOS symptoms.  She was diagnosed with depression, anxiety, and PTSD and started on Buspar  twice daily and Lexapro  5 mg daily last month. She notes improvement in her ability to perform daily activities such as getting out of bed and taking care of her cats, although she still finds these tasks unenjoyable.  She is experiencing frustration with insurance issues, as her current provider is out of network, leading to unexpected billing. She is considering canceling her upcoming appointment with [facility] due to these issues and is seeking alternative in-network providers.  She has concerns about PCOS, noting symptoms such as frequent and heavy  periods, nausea, and acid reflux. She mentions having her period four times last month despite having a Nexplanon implant, which is due to expire next year. She is interested in exploring surgical options to manage her  symptoms and prevent future pregnancies.  She reports a recent move and financial difficulties, including the inability to afford care for her pets, resulting in the death of three cats. She lives alone in an apartment and is engaged, but her fianc lives in a different location. She is currently struggling with transportation issues as her car has died.  She is concerned about weight management, stating that despite following a proper diet, she is not seeing results. She is interested in exploring nutritional counseling and medication options to address this issue.  She has been experiencing symptoms such as nausea and frequent periods. She is interested in understanding more about her hormone levels and how they relate to her symptoms.     Past Medical History:  Diagnosis Date   Autism    Depression    Dysautonomia (HCC)    GERD (gastroesophageal reflux disease)    Seizures (HCC)    childhood, grew out of this age 28 or so    No past surgical history on file.  Family History  Problem Relation Age of Onset   Healthy Mother    High Cholesterol Father    Cancer Maternal Grandmother    Hyperlipidemia Maternal Grandmother    Hypertension Maternal Grandmother    Depression Maternal Grandfather    Heart attack Maternal Grandfather    Hypertension Maternal Grandfather    Hyperlipidemia Maternal Grandfather    Cancer Paternal Grandfather        Died at the age of 75-Lymphoma    Social History   Tobacco Use   Smoking status: Never   Smokeless tobacco: Never  Vaping Use   Vaping status: Never Used  Substance Use Topics   Alcohol use: Yes    Alcohol/week: 3.0 standard drinks of alcohol    Types: 1 Glasses of wine, 1 Cans of beer, 1 Shots of liquor per week   Drug use: Not Currently    Types: Marijuana     Allergies  Allergen Reactions   Amoxicillin Other (See Comments)    Parents reported seizures    Review of Systems NEGATIVE UNLESS OTHERWISE INDICATED IN HPI       Objective:     BP 122/76 (BP Location: Left Arm, Patient Position: Sitting, Cuff Size: Normal)   Pulse 85   Temp 97.7 F (36.5 C) (Temporal)   Ht 4' 11 (1.499 m)   Wt 179 lb 9.6 oz (81.5 kg)   LMP 05/05/2024 (Exact Date)   SpO2 96%   BMI 36.27 kg/m   Wt Readings from Last 3 Encounters:  05/06/24 179 lb 9.6 oz (81.5 kg)  04/10/24 185 lb (83.9 kg)  12/19/23 182 lb (82.6 kg)    BP Readings from Last 3 Encounters:  05/06/24 122/76  04/10/24 118/60  12/19/23 102/84     Physical Exam Vitals and nursing note reviewed.  Constitutional:      General: She is not in acute distress.    Appearance: Normal appearance. She is obese. She is not ill-appearing.  HENT:     Head: Normocephalic and atraumatic.  Cardiovascular:     Rate and Rhythm: Normal rate and regular rhythm.     Pulses: Normal pulses.     Heart sounds: Normal heart sounds.  Pulmonary:     Effort: Pulmonary effort  is normal.     Breath sounds: Normal breath sounds.  Skin:    General: Skin is warm and dry.  Neurological:     General: No focal deficit present.     Mental Status: She is alert.  Psychiatric:        Mood and Affect: Mood is depressed.             Saul Dorsi M Georgianne Gritz, PA-C

## 2024-05-06 NOTE — Patient Instructions (Signed)
  VISIT SUMMARY: Today, we discussed your ongoing management of depression, anxiety, and PCOS symptoms. We adjusted your medications to better address your mental health and introduced a new medication to help manage your PCOS symptoms. We also talked about your contraceptive options and referred you to a specialist for further evaluation.  YOUR PLAN: POLYCYSTIC OVARIAN SYNDROME (PCOS): You have PCOS with symptoms like frequent and heavy periods, nausea, and weight management challenges due to insulin  resistance. -Start taking metformin  500 mg extended-release, 1 tablet with dinner daily to help with insulin  resistance and weight management. -You will be referred to [facility] for dietary management specific to PCOS. -We have ordered an insulin  random test through [facility].  DEPRESSION AND ANXIETY: Your depression and anxiety are being managed with Buspar  and Lexapro , but we need to adjust the dosages to better control your symptoms. -Increase Buspar  to 7.5 mg twice daily. -Increase Lexapro  to 10 mg once daily. -[facility] [place].  CONTRACEPTIVE MANAGEMENT AND GYNECOLOGICAL SURGERY: Your current contraceptive method (Nexplanon) is not effectively managing your PCOS symptoms, and you are interested in permanent contraception options. -You will be referred to Dr. Almarie Carpen for a consultation regarding hysterectomy and/or tubal ligation.                      Contains text generated by [employer].                                 Contains text generated by [employer].

## 2024-05-07 LAB — INSULIN, RANDOM: INSULIN: 20.6 u[IU]/mL (ref 2.6–24.9)

## 2024-05-07 LAB — HEPATITIS C ANTIBODY: Hep C Virus Ab: NONREACTIVE

## 2024-05-07 LAB — HIV ANTIBODY (ROUTINE TESTING W REFLEX): HIV Screen 4th Generation wRfx: NONREACTIVE

## 2024-05-08 ENCOUNTER — Ambulatory Visit: Payer: Self-pay | Admitting: Physician Assistant

## 2024-05-20 ENCOUNTER — Ambulatory Visit: Admitting: Physician Assistant

## 2024-07-09 ENCOUNTER — Other Ambulatory Visit: Payer: Self-pay | Admitting: Physician Assistant

## 2024-07-09 DIAGNOSIS — F39 Unspecified mood [affective] disorder: Secondary | ICD-10-CM

## 2024-07-11 ENCOUNTER — Ambulatory Visit: Admitting: Physician Assistant

## 2024-08-05 ENCOUNTER — Ambulatory Visit: Admitting: Physician Assistant

## 2024-08-24 ENCOUNTER — Other Ambulatory Visit: Payer: Self-pay | Admitting: Physician Assistant

## 2024-08-25 ENCOUNTER — Other Ambulatory Visit: Payer: Self-pay

## 2024-09-07 ENCOUNTER — Other Ambulatory Visit: Payer: Self-pay | Admitting: Physician Assistant

## 2024-09-07 DIAGNOSIS — E282 Polycystic ovarian syndrome: Secondary | ICD-10-CM

## 2024-09-21 ENCOUNTER — Other Ambulatory Visit: Payer: Self-pay | Admitting: Physician Assistant

## 2024-09-21 DIAGNOSIS — F39 Unspecified mood [affective] disorder: Secondary | ICD-10-CM

## 2024-09-24 ENCOUNTER — Other Ambulatory Visit: Payer: Self-pay | Admitting: Physician Assistant
